# Patient Record
Sex: Female | Born: 1945 | Race: White | Hispanic: No | Marital: Married | Smoking: Never smoker
Health system: Southern US, Community
[De-identification: ages and names within clinical notes are randomized; demographics above are authoritative.]

## PROBLEM LIST (undated history)

## (undated) DIAGNOSIS — R413 Other amnesia: Secondary | ICD-10-CM

## (undated) DIAGNOSIS — M810 Age-related osteoporosis without current pathological fracture: Secondary | ICD-10-CM

## (undated) DIAGNOSIS — R609 Edema, unspecified: Secondary | ICD-10-CM

## (undated) DIAGNOSIS — K219 Gastro-esophageal reflux disease without esophagitis: Secondary | ICD-10-CM

## (undated) DIAGNOSIS — I10 Essential (primary) hypertension: Secondary | ICD-10-CM

## (undated) HISTORY — PX: CHOLECYSTECTOMY: SHX55

## (undated) HISTORY — PX: ABDOMINAL HYSTERECTOMY: SHX81

## (undated) HISTORY — DX: Essential (primary) hypertension: I10

## (undated) HISTORY — DX: Other amnesia: R41.3

## (undated) HISTORY — DX: Age-related osteoporosis without current pathological fracture: M81.0

---

## 2011-05-07 ENCOUNTER — Other Ambulatory Visit: Payer: Self-pay

## 2011-05-07 ENCOUNTER — Emergency Department (INDEPENDENT_AMBULATORY_CARE_PROVIDER_SITE_OTHER): Payer: Medicare Other

## 2011-05-07 ENCOUNTER — Emergency Department (HOSPITAL_BASED_OUTPATIENT_CLINIC_OR_DEPARTMENT_OTHER)
Admission: EM | Admit: 2011-05-07 | Discharge: 2011-05-07 | Disposition: A | Discharge status: Home / Self Care | Payer: Medicare Other | Attending: Emergency Medicine | Admitting: Emergency Medicine

## 2011-05-07 ENCOUNTER — Encounter (HOSPITAL_BASED_OUTPATIENT_CLINIC_OR_DEPARTMENT_OTHER): Payer: Self-pay | Admitting: *Deleted

## 2011-05-07 DIAGNOSIS — F29 Unspecified psychosis not due to a substance or known physiological condition: Secondary | ICD-10-CM

## 2011-05-07 DIAGNOSIS — IMO0002 Reserved for concepts with insufficient information to code with codable children: Secondary | ICD-10-CM | POA: Insufficient documentation

## 2011-05-07 DIAGNOSIS — R413 Other amnesia: Secondary | ICD-10-CM

## 2011-05-07 DIAGNOSIS — R4182 Altered mental status, unspecified: Secondary | ICD-10-CM

## 2011-05-07 DIAGNOSIS — K219 Gastro-esophageal reflux disease without esophagitis: Secondary | ICD-10-CM | POA: Insufficient documentation

## 2011-05-07 DIAGNOSIS — Z79899 Other long term (current) drug therapy: Secondary | ICD-10-CM | POA: Insufficient documentation

## 2011-05-07 HISTORY — DX: Gastro-esophageal reflux disease without esophagitis: K21.9

## 2011-05-07 HISTORY — DX: Edema, unspecified: R60.9

## 2011-05-07 LAB — CBC
HCT: 44.5 % (ref 36.0–46.0)
Hemoglobin: 15.7 g/dL — ABNORMAL HIGH (ref 12.0–15.0)
MCH: 30.6 pg (ref 26.0–34.0)
MCHC: 35.3 g/dL (ref 30.0–36.0)
MCV: 86.7 fL (ref 78.0–100.0)
RBC: 5.13 MIL/uL — ABNORMAL HIGH (ref 3.87–5.11)

## 2011-05-07 LAB — URINE MICROSCOPIC-ADD ON

## 2011-05-07 LAB — DIFFERENTIAL
Basophils Relative: 0 % (ref 0–1)
Eosinophils Absolute: 0.1 10*3/uL (ref 0.0–0.7)
Lymphs Abs: 2.6 10*3/uL (ref 0.7–4.0)
Monocytes Absolute: 0.8 10*3/uL (ref 0.1–1.0)
Monocytes Relative: 9 % (ref 3–12)

## 2011-05-07 LAB — URINALYSIS, ROUTINE W REFLEX MICROSCOPIC
Bilirubin Urine: NEGATIVE
Ketones, ur: 15 mg/dL — AB
Nitrite: NEGATIVE
Urobilinogen, UA: 0.2 mg/dL (ref 0.0–1.0)
pH: 5.5 (ref 5.0–8.0)

## 2011-05-07 LAB — COMPREHENSIVE METABOLIC PANEL
Albumin: 4.6 g/dL (ref 3.5–5.2)
Alkaline Phosphatase: 65 U/L (ref 39–117)
BUN: 28 mg/dL — ABNORMAL HIGH (ref 6–23)
Creatinine, Ser: 1.1 mg/dL (ref 0.50–1.10)
GFR calc Af Amer: 59 mL/min — ABNORMAL LOW (ref >90)
Glucose, Bld: 88 mg/dL (ref 70–99)
Total Bilirubin: 0.3 mg/dL (ref 0.3–1.2)
Total Protein: 7.6 g/dL (ref 6.0–8.3)

## 2011-05-07 LAB — TROPONIN I: Troponin I: <0.3 ng/mL (ref <0.30)

## 2011-05-07 MED ORDER — POTASSIUM CHLORIDE CRYS ER 20 MEQ PO TBCR
EXTENDED_RELEASE_TABLET | ORAL | Status: AC
  Administered 2011-05-07: 40 meq
  Filled 2011-05-07: qty 2

## 2011-05-07 MED ORDER — POTASSIUM CHLORIDE 20 MEQ PO PACK
40.0000 meq | PACK | Freq: Once | ORAL | Status: DC
  Filled 2011-05-07: qty 2

## 2011-05-07 NOTE — ED Notes (Signed)
Pt. Keeps asking about her brother dying.  Pt does not know what day it is and what is going on at times.  Pt. Is able to speak with her husband and recall some things.  Pt keeps asking about her brother dying and then ask the nurse multiple times who I am.

## 2011-05-07 NOTE — ED Notes (Signed)
Husband says they were working in the yard when she had a sudden onset of confusion. Pt is alert oriented at times. Her brother recently died and she has no recollection of event and gets emotional when event is mentioned. She is neuro intact.

## 2011-05-07 NOTE — ED Provider Notes (Signed)
History     CSN: [id number]  Arrival date & time 05/07/11  1634   First MD Initiated Contact with Patient 05/07/11 1651      Chief Complaint  Patient presents with  . Altered Mental Status    HPI Patient presents to the emergency room with complaints of amnesia. History is obtained from the patient and her husband. Husband states that they were both working out in the yard when his wife came up to him and asked what the time was. a couple minutes later she then came up to him and asked him what the date was in was she supposed to go to work.  Her husband reminded her that she had the day off from work because the office was closed. They continue their conversation the patient continued to not have any significant short-term memory. She had forgotten that her brother had recently passed away. She still could not remember the date or time. She told her husband that she couldn't remember anything. He states it might have gotten a little bit better but it has persisted. Patient has not had any trouble with speech difficulty, numbness, weakness, headache or other complaints. She's never had anything like this before. Past Medical History  Diagnosis Date  . GERD (gastroesophageal reflux disease)   . Fluid retention     Past Surgical History  Procedure Date  . Abdominal hysterectomy   . Cholecystectomy     No family history on file.  History  Substance Use Topics  . Smoking status: Never Smoker   . Smokeless tobacco: Not on file  . Alcohol Use: Yes    OB History    Grav Para Term Preterm Abortions TAB SAB Ect Mult Living                  Review of Systems  All other systems reviewed and are negative.    Allergies  Sulfa antibiotics  Home Medications   Current Outpatient Rx  Name Route Sig Dispense Refill  . HYDROCHLOROTHIAZIDE PO Oral Take by mouth.      BP 142/88  Pulse 87  Temp(Src) 97 F (36.1 C) (Oral)  Resp 16  Ht 5\' 1"  (1.549 m)  Wt 136 lb (61.689 kg)   BMI 25.70 kg/m2  SpO2 97%  Physical Exam  Nursing note and vitals reviewed. Constitutional: She appears well-developed and well-nourished. No distress.  HENT:  Head: Normocephalic and atraumatic.  Right Ear: External ear normal.  Left Ear: External ear normal.  Mouth/Throat: Oropharynx is clear and moist.  Eyes: Conjunctivae are normal. Right eye exhibits no discharge. Left eye exhibits no discharge. No scleral icterus.  Neck: Neck supple. No tracheal deviation present.  Cardiovascular: Normal rate, regular rhythm and intact distal pulses.   Pulmonary/Chest: Effort normal and breath sounds normal. No stridor. No respiratory distress. She has no wheezes. She has no rales.  Abdominal: Soft. Bowel sounds are normal. She exhibits no distension. There is no tenderness. There is no rebound and no guarding.  Musculoskeletal: She exhibits no edema and no tenderness.  Neurological: She is alert. She has normal strength. She is disoriented. No cranial nerve deficit ( ) or sensory deficit. She exhibits normal muscle tone. She displays no seizure activity. Coordination normal. GCS eye subscore is 4. GCS verbal subscore is 4. GCS motor subscore is 6.       No pronator drift bilateral upper extrem, able to hold both legs off bed for 5 seconds, sensation intact in all extremities,  no visual field cuts, no left or right sided neglect,  Pt unable to remember the date/year  Skin: Skin is warm and dry. No rash noted.  Psychiatric: She has a normal mood and affect.    ED Course  Procedures (including critical care time)  Date: 05/07/2011  Rate: 81  Rhythm: normal sinus rhythm  QRS Axis: normal  Intervals: normal  ST/T Wave abnormalities: normal  Conduction Disutrbances:none  Narrative Interpretation:   Old EKG Reviewed: none available   Labs Reviewed  CBC - Abnormal; Notable for the following:    RBC 5.13 (*)    Hemoglobin 15.7 (*)    All other components within normal limits  COMPREHENSIVE  METABOLIC PANEL - Abnormal; Notable for the following:    Potassium 3.0 (*)    BUN 28 (*)    GFR calc non Af Amer 51 (*)    GFR calc Af Amer 59 (*)    All other components within normal limits  URINALYSIS, ROUTINE W REFLEX MICROSCOPIC - Abnormal; Notable for the following:    Ketones, ur 15 (*)    Leukocytes, UA SMALL (*)    All other components within normal limits  URINE MICROSCOPIC-ADD ON - Abnormal; Notable for the following:    Bacteria, UA FEW (*)    All other components within normal limits  PROTIME-INR  APTT  DIFFERENTIAL  TROPONIN I  GLUCOSE, CAPILLARY   Ct Head Wo Contrast  05/07/2011  *RADIOLOGY REPORT*  Clinical Data: All status.  Some onset confusion.  CT HEAD WITHOUT CONTRAST  Technique:  Contiguous axial images were obtained from the base of the skull through the vertex without contrast.  Comparison: None.  Findings: No mass lesion, mass effect, midline shift, hydrocephalus, hemorrhage.  No territorial ischemia or acute infarction.  Paranasal sinuses and calvarium appear within normal limits.  Right-sided nasal septal spur.  Hyperostosis frontalis interna.  IMPRESSION: Negative CT head.  Original Report Authenticated By: Andreas Newport, M.D.    MDM  The patient is having persistent amnesia. The patient is still asking repetitive questions. Otherwise, there are no focal neurologic deficits on exam. At this time it is possible the symptoms are related to stress and anxiety associated with the death of her brother. However, it is possible that a stroke has caused the symptoms as well.  I do feel it is reasonable to have further evaluation including MRI scanning. I discussed these findings with the patient and her husband and they agree about additional testing. Her primary care doctor is Dr. Alben Spittle in [facility].  The patient requests transfer to [facility].        Celene Kras, MD 05/07/11 [phone]

## 2017-01-26 ENCOUNTER — Encounter: Payer: Self-pay | Admitting: Neurology

## 2017-01-27 ENCOUNTER — Encounter: Payer: Self-pay | Admitting: Neurology

## 2017-01-27 ENCOUNTER — Encounter (INDEPENDENT_AMBULATORY_CARE_PROVIDER_SITE_OTHER): Payer: Self-pay

## 2017-01-27 ENCOUNTER — Ambulatory Visit (INDEPENDENT_AMBULATORY_CARE_PROVIDER_SITE_OTHER): Payer: Medicare Other | Admitting: Neurology

## 2017-01-27 ENCOUNTER — Other Ambulatory Visit: Payer: Self-pay | Admitting: Neurology

## 2017-01-27 VITALS — BP 157/92 | HR 74 | Ht 62.0 in | Wt 130.0 lb

## 2017-01-27 DIAGNOSIS — R292 Abnormal reflex: Secondary | ICD-10-CM | POA: Diagnosis not present

## 2017-01-27 DIAGNOSIS — G3109 Other frontotemporal dementia: Secondary | ICD-10-CM

## 2017-01-27 DIAGNOSIS — R251 Tremor, unspecified: Secondary | ICD-10-CM | POA: Insufficient documentation

## 2017-01-27 DIAGNOSIS — R488 Other symbolic dysfunctions: Secondary | ICD-10-CM

## 2017-01-27 DIAGNOSIS — R413 Other amnesia: Secondary | ICD-10-CM

## 2017-01-27 DIAGNOSIS — G319 Degenerative disease of nervous system, unspecified: Secondary | ICD-10-CM

## 2017-01-27 NOTE — Patient Instructions (Signed)
Dementia Dementia means losing some of your brain ability. People with dementia may have problems with:  Memory.  Making decisions.  Behavior.  Speaking.  Thinking.  Solving problems.  Follow these instructions at home: Medicine  Take over-the-counter and prescription medicines only as told by your doctor.  Avoid taking medicines that can change how you think. These include pain or sleeping medicines. Lifestyle   Make healthy choices: ? Be active as told by your doctor. ? Do not use any tobacco products, such as cigarettes, chewing tobacco, and e-cigarettes. If you need help quitting, ask your doctor. ? Eat a healthy diet. ? When you get stressed, do something to help yourself relax. Your doctor can give you tips. ? Spend time with other people.  Drink enough fluid to keep your pee (urine) clear or pale yellow.  Make sure you get good sleep. Use these tips to help you get a good night's rest: ? Try not to take naps during the day. ? Keep your sleeping area dark and cool. ? In the few hours before you go to bed, try not to do any exercise. ? Try not to have foods and drinks with caffeine in the evening. General instructions  Talk with your doctor to figure out: ? What you need help with. ? What your safety needs are.  If you were given a bracelet that tracks your location, make sure to wear it.  Keep all follow-up visits as told by your doctor. This is important. Contact a doctor if:  You have any new problems.  You have problems with choking or swallowing.  You have any symptoms of a different sickness. Get help right away if:  You have a fever.  You feel mixed up (confused) or more mixed up than before.  You have new sleepiness.  You have sleepiness that gets worse.  You have a hard time staying awake.  You or your family members are worried for your safety. This information is not intended to replace advice given to you by your health care  provider. Make sure you discuss any questions you have with your health care provider. Document Released: 01/08/2008 Document Revised: 07/03/2015 Document Reviewed: 10/23/2014 Elsevier Interactive Patient Education  2018 Elsevier Inc.  

## 2017-01-27 NOTE — Progress Notes (Signed)
SLEEP MEDICINE CLINIC   Provider:  Melvyn Novasarmen  Dariush Mcnellis, Jade Bell  Primary Care Physician:  Jade Bell, Jade M, MD   Referring Provider: Jolene Bell, Jade M, MD    Chief Complaint  Patient presents with  . New Patient (Initial Visit)    pt with husband, rm [id number]. pt's husband states this happened year and half ago. stress induced.     HPI:  Jade AdamSandra Bell is a 71 y.o. female , seen here  in a referral from Dr. William Bell for Memory loss.   His Jade Bell is very concerned about her perceived cognitive decline.  5 years ago in the year 2013 she had suffered a transient global amnesia spell related to the death of her brother.  She had no other neuropsychological events, and there is no history of brain trauma. She is awaiting a neuropsychological detailed testing in March of this year at [facility]. Jade Bell had great difficulties with a Montreal cognitive assessment but was able to work on the Mini-Mental Status Examination by [employer].  She scored 21 out of 30 points.  She had difficulties with calculation but she was able remarkably to remember 2 out of 5 words which is often the most difficult part of the test.  I wonder if her anxiety is partially aggravating the test results. She has good days and good hours, is not all the time the same.  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   The last 18 months have certainly been difficult for the patient.  Jade Bell is concerned that she may enter a future like stage while driving or being away from home and therefore the couple decided that she should not drive at this time.  He balances the checkbook and keeps the family finances, does most of the shopping and cooking.  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  He noticed repetitive questions and perseveration. Whats  the date, what is the day ? She is unable to recall appointments.  Chief complaint according to patient : " I am so anxious, I loose my mind"    Family history- no dementia. Mother passed at 5165- lung cancer , father was 2892.   Social history: non smoker, 1 glass of wine once a week or once a month. Caffeine : coffee - 2-3 cups a day, no sodas. Married, adult children. 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  husband is a Runner, broadcasting/film/videoteacher. [employer] classes after HS>   Review of Systems: Out of a complete 14 system review, the patient complains of only the following symptoms, and all other reviewed systems are negative.    Social History   Socioeconomic History  . Marital status: Married    Spouse name: Not on file  . Number of children: Not on file  . Years of education: Not on file  . Highest education level: Not on file  Social Needs  . Financial resource strain: Not on file  . Food insecurity - worry: Not on file  . Food insecurity - inability: Not on file  . Transportation needs - medical: Not on file  . Transportation needs - non-medical: Not on file  Occupational History  . Not on file  Tobacco Use  . Smoking status: Never Smoker  . Smokeless  tobacco: Never Used  Substance and Sexual Activity  . Alcohol use: Yes  . Drug use: No  . Sexual activity: Not on file  Other Topics Concern  . Not on file  Social History Narrative  . Not on file    No family history on file.  Past Medical History:  Diagnosis Date  . Fluid retention   . GERD (gastroesophageal reflux disease)   . Hypertension   . Memory changes   . Osteoporosis     Past Surgical History:  Procedure Laterality Date  . ABDOMINAL HYSTERECTOMY    . CHOLECYSTECTOMY      Current Outpatient Medications  Medication Sig Dispense Refill  . alendronate (FOSAMAX) 70 MG tablet Take 70 mg by mouth once a week. Take with a full glass of water on an empty stomach.    [name]  aspirin 81 MG tablet Take 81 mg by mouth daily.    . cholecalciferol (VITAMIN Bell) 1000 units tablet Take by mouth.    . hydrochlorothiazide (MICROZIDE) 12.5 MG capsule Take 1 capsule by mouth daily.     . Multiple Vitamin (MULTIVITAMIN) tablet Take 1 tablet by mouth daily.    [name] omeprazole (PRILOSEC) 40 MG capsule Take 40 mg by mouth daily.     No current facility-administered medications for this visit.     Allergies as of 01/27/2017 - Review Complete 01/27/2017  Allergen Reaction Noted  . Sulfa antibiotics  05/07/2011    Vitals: BP (!) 157/92   Pulse 74   Ht 5\' 2"  (1.575 m)   Wt 130 lb (59 kg)   BMI 23.78 kg/m  Last Weight:  Wt Readings from Last 1 Encounters:  01/27/17 130 lb (59 kg)   ZOX:WRUE mass index is 23.78 kg/m.     Last Height:   Ht Readings from Last 1 Encounters:  01/27/17 5\' 2"  (1.575 m)    MMSE - Mini Mental State Exam 01/27/2017  Orientation to time 3  Orientation to Place 4  Registration 3  Attention/ Calculation 2  Recall 2  Language- name 2 objects 1  Language- repeat 1  Language- follow 3 step command 3  Language- read & follow direction 1  Write a sentence 1  Copy design 0  Total score 21     Physical exam:  General: The patient is awake, alert and appears not in acute distress. The patient is well groomed. Head: Normocephalic, atraumatic. Neck is supple.  Cardiovascular:  Regular rate and rhythm , without  murmurs or carotid bruit, and without distended neck veins. Respiratory: Lungs are clear to auscultation. Skin:  Without evidence of edema, or rash Trunk: BMI is nl. The patient's posture is erect, not stooped.    Neurologic exam : The patient is awake and alert, oriented to place and time.   Memory subjective  described as impaired.  MOCA: Montreal Cognitive Assessment  01/27/2017  Naming (0/3) 2  Attention span & concentration ability appears impaired.  Speech is fluent, without dysarthria, dysphonia but she looks for words.    Mood and affect are anxious..   Cranial nerves: Pupils are equal and briskly reactive to light. Funduscopic exam without evidence of pallor or edema. Extraocular movements  in vertical and horizontal planes intact and without nystagmus. Visual fields by finger perimetry are intact. Hearing to finger rub intact.   Facial sensation intact to fine touch.  Facial motor strength is symmetric and tongue and uvula move midline. Shoulder shrug was symmetrical.   Motor exam: This is  Jade Bell has bilaterally equal muscle bulk, and elevated muscle tone, there is some cogwheeling noted over biceps triceps at the shoulder musculature as well as at the wrist.  There are some arthritic related muscle atrophy's and joint swelling seen in both hands.  Grip strength was surprisingly strong,  Sensory:  Fine touch, pinprick and vibration were tested in all extremities. Proprioception tested in the upper extremities was normal.  Coordination: There is bilateral mild action tremor noted with finger to nose maneuver bilateral some pronation, indicating weakness of the upper extremity musculature.  There was dysmetria. Gait and station: Patient walks without assistive device with normal arm swing. Turns with very small steps, took about 6 steps to turn 180 degrees . Romberg testing is  negative.  Deep tendon reflexes: in the  upper and lower extremities were very brisk there is actually a cross reaction to the patellar reflex, and I would rate this reflex level at 4+. There are up-going babinski responses.   Assessment:  After physical and neurologic examination, review of laboratory studies,  Personal review of imaging studies, reports of other /same  Imaging studies, results of polysomnography and / or neurophysiology testing and pre-existing records as far as provided in visit., my assessment is   1) Dementia- Todays Mini-Mental Status Examination may have been influenced by the patient's level of anxiety and worry,  she certainly felt under stress and a testing situation.   However a 21 out of 30 Mini-Mental Status Examination would indicate a cognitive impairment.  The distribution of cognitive weakness is atypical as she could recall 2 out of 3 delayed recall words usually the first deficits seen in Alzheimer patient would be the loss of recall words.  2) the patient is slightly jittery does have a tremor with finger to nose test, does have dysmetria.  Muscle tone is elevated  3) Mrs. Jade Bell is hyperreflexic.  This affects the upper and lower extremities and I will need to obtain an MRI of the brain and MRA I would like the studies with contrast.  She does not not have a history of traumatic brain injury, she does not have a history of malignancy, she has not been on heavy duty psychotropic medications.   The patient was advised of the nature of the diagnosed disorder , the treatment options and the  risks for general health and wellness arising from not treating the condition.   I spent more than 60 minutes of face to face time with the patient.  Greater than 50% of time was spent in counseling and coordination of care. We have discussed the diagnosis and differential and I answered the patient's questions.    Plan:  Treatment plan and additional workup : Dementia work up- MRI brain images, not MRA.  Labs- EEG    Melvyn NovasARMEN Klea Nall, MD 01/27/2017, 2:12 PM  Certified in Neurology by ABPN Certified in Sleep Medicine by [facility]  [facility] [address], [address] [place], [place] [address]

## 2017-01-27 NOTE — Addendum Note (Signed)
Addended by: Tamera StandsHINNANT, Vadhir Mcnay D on: 01/27/2017 03:34 PM   Modules accepted: Orders

## 2017-01-28 LAB — COMPREHENSIVE METABOLIC PANEL
ALT: 19 IU/L (ref 0–32)
AST: 25 IU/L (ref 0–40)
Albumin/Globulin Ratio: 2 (ref 1.2–2.2)
Albumin: 4.7 g/dL (ref 3.5–4.8)
Alkaline Phosphatase: 54 IU/L (ref 39–117)
BUN/Creatinine Ratio: 17 (ref 12–28)
BUN: 17 mg/dL (ref 8–27)
Bilirubin Total: 0.4 mg/dL (ref 0.0–1.2)
CALCIUM: 10.7 mg/dL — AB (ref 8.7–10.3)
CO2: 25 mmol/L (ref 20–29)
CREATININE: 1.03 mg/dL — AB (ref 0.57–1.00)
Chloride: 102 mmol/L (ref 96–106)
GFR, EST AFRICAN AMERICAN: 62 mL/min/{1.73_m2} (ref >59)
GFR, EST NON AFRICAN AMERICAN: 54 mL/min/{1.73_m2} — AB (ref >59)
GLUCOSE: 92 mg/dL (ref 65–99)
Globulin, Total: 2.3 g/dL (ref 1.5–4.5)
POTASSIUM: 4.1 mmol/L (ref 3.5–5.2)
Sodium: 143 mmol/L (ref 134–144)
TOTAL PROTEIN: 7 g/dL (ref 6.0–8.5)

## 2017-01-29 ENCOUNTER — Ambulatory Visit (HOSPITAL_BASED_OUTPATIENT_CLINIC_OR_DEPARTMENT_OTHER)
Admission: RE | Admit: 2017-01-29 | Discharge: 2017-01-29 | Disposition: A | Discharge status: Home / Self Care | Payer: Medicare Other | Source: Ambulatory Visit | Attending: Neurology | Admitting: Neurology

## 2017-01-29 DIAGNOSIS — G3109 Other frontotemporal dementia: Secondary | ICD-10-CM | POA: Insufficient documentation

## 2017-01-29 DIAGNOSIS — G319 Degenerative disease of nervous system, unspecified: Secondary | ICD-10-CM | POA: Diagnosis not present

## 2017-01-29 MED ORDER — GADOBENATE DIMEGLUMINE 529 MG/ML IV SOLN
10.0000 mL | Freq: Once | INTRAVENOUS | Status: AC | PRN
  Administered 2017-01-29: 10 mL via INTRAVENOUS

## 2017-02-02 LAB — CBC WITH DIFFERENTIAL/PLATELET
BASOS ABS: 0 10*3/uL (ref 0.0–0.2)
BASOS: 1 %
EOS (ABSOLUTE): 0.1 10*3/uL (ref 0.0–0.4)
Eos: 1 %
Hematocrit: 47.4 % — ABNORMAL HIGH (ref 34.0–46.6)
Hemoglobin: 15.6 g/dL (ref 11.1–15.9)
IMMATURE GRANS (ABS): 0 10*3/uL (ref 0.0–0.1)
IMMATURE GRANULOCYTES: 0 %
LYMPHS: 29 %
Lymphocytes Absolute: 2.5 10*3/uL (ref 0.7–3.1)
MCH: 30.3 pg (ref 26.6–33.0)
MCHC: 32.9 g/dL (ref 31.5–35.7)
MCV: 92 fL (ref 79–97)
MONOS ABS: 0.8 10*3/uL (ref 0.1–0.9)
Monocytes: 9 %
NEUTROS PCT: 60 %
Neutrophils Absolute: 5.2 10*3/uL (ref 1.4–7.0)
PLATELETS: 318 10*3/uL (ref 150–379)
RBC: 5.15 x10E6/uL (ref 3.77–5.28)
RDW: 14 % (ref 12.3–15.4)
WBC: 8.6 10*3/uL (ref 3.4–10.8)

## 2017-02-02 LAB — ANA W/REFLEX

## 2017-02-02 LAB — TSH+FREE T4
Free T4: 1.41 ng/dL (ref 0.82–1.77)
TSH: 1.93 u[IU]/mL (ref 0.450–4.500)

## 2017-02-02 LAB — METHYLMALONIC ACID, SERUM

## 2017-02-03 ENCOUNTER — Telehealth: Payer: Self-pay | Admitting: *Deleted

## 2017-02-03 NOTE — Telephone Encounter (Signed)
Spoke with patient's husband, [place] on [place] and informed him that overall her MRI brain showed some mild brain atrophy, but not in one area predominantly. Advised there is no tumor, demyelination, stroke or scar tissue formation. Advised him Dr Vickey Hugerohmeier considers this brain unremarkable for her age. Informed him of her lab results: Elevated calcium- perhaps due to supplements.  Kidney function is slightly reduced, but improved over the lab 5 years ago. No anemia is noted. The Thyroid hormone is normal, and she is negative for b 12 deficiency. Other tests are still pending. Husband verbalized understanding, appreciation for call.

## 2017-02-05 LAB — METHYLMALONIC ACID, SERUM: Methylmalonic Acid: 162 nmol/L (ref 0–378)

## 2017-02-05 LAB — ANA W/REFLEX: ANA: NEGATIVE

## 2017-02-05 LAB — SPECIMEN STATUS REPORT

## 2017-02-10 ENCOUNTER — Ambulatory Visit (INDEPENDENT_AMBULATORY_CARE_PROVIDER_SITE_OTHER): Payer: Medicare Other | Admitting: Neurology

## 2017-02-10 DIAGNOSIS — G319 Degenerative disease of nervous system, unspecified: Secondary | ICD-10-CM

## 2017-02-10 DIAGNOSIS — R410 Disorientation, unspecified: Secondary | ICD-10-CM

## 2017-02-10 DIAGNOSIS — R488 Other symbolic dysfunctions: Secondary | ICD-10-CM

## 2017-02-10 DIAGNOSIS — G3109 Other frontotemporal dementia: Secondary | ICD-10-CM

## 2017-02-10 DIAGNOSIS — R292 Abnormal reflex: Secondary | ICD-10-CM

## 2017-02-10 DIAGNOSIS — R251 Tremor, unspecified: Secondary | ICD-10-CM

## 2017-02-11 NOTE — Procedures (Signed)
   HISTORY: 72 years old with memory loss,   TECHNIQUE:  16 channel EEG was performed based on standard 10-16 international system. One channel was dedicated to EKG, which has demonstrates normal sinus rhythm of 72 beats per minutes.  Upon awakening, the posterior background activity was well-developed, with mixed alpha and theta range activities, reactive to eye opening and closure.  There was no evidence of epileptiform discharge.  There are frequent muscle artifacts.  Photic stimulation was performed, which induced a symmetric photic driving.  Hyperventilation was performed, there was no abnormality elicit.  No sleep was achieved.  CONCLUSION: This is a mild abnormal EEG.  There is electrodiagnostic evidence of mild background slowing, indicating mild bi-hemisphere malfunction.  Common etiology are metabolic toxic, central nervous system degenerative disorders.  Levert FeinsteinYijun Cayman Brogden, M.D. Ph.D.  [facility] [address] [place], [place] [address] Phone: [phone] Fax:      [phone]

## 2017-02-15 ENCOUNTER — Telehealth: Payer: Self-pay | Admitting: Neurology

## 2017-02-15 NOTE — Telephone Encounter (Signed)
Called the patient and made her aware of the EEG findings. The patient has a follow up apt for 1/24. We will discuss this in more detail at her follow up apt. Pt verbalized understanding.

## 2017-02-15 NOTE — Telephone Encounter (Signed)
-----   Message from Melvyn Novasarmen Dohmeier, MD sent at 02/14/2017  8:22 AM EST ----- mildy slowed EEG- non specific changes. No sleep was recorded.

## 2017-03-03 ENCOUNTER — Encounter: Payer: Self-pay | Admitting: Neurology

## 2017-03-03 ENCOUNTER — Ambulatory Visit (INDEPENDENT_AMBULATORY_CARE_PROVIDER_SITE_OTHER): Payer: Medicare Other | Admitting: Neurology

## 2017-03-03 DIAGNOSIS — F0390 Unspecified dementia without behavioral disturbance: Secondary | ICD-10-CM | POA: Diagnosis not present

## 2017-03-03 MED ORDER — DONEPEZIL HCL 5 MG PO TABS: ORAL_TABLET | ORAL | 0 refills | Status: DC

## 2017-03-03 NOTE — Progress Notes (Signed)
[facility]   Provider:  Melvyn Novas, [place]  Primary Care Physician:  Jolene Provost, MD   Referring Provider: Jolene Provost, MD    Chief Complaint  Patient presents with  . Follow-up    pt is with husband, rm 10.     HPI:  Jade Bell is a 72 y.o. female , seen here for Memory loss.   CONSULT: His Zuluaga is very concerned about her perceived cognitive decline.  5 years ago in the year 2013 she had suffered a transient global amnesia spell related to the death of her brother. She had no other neuropsychological events, and there is no history of brain trauma. She is awaiting a neuropsychological detailed testing in March of this year at [facility]. Jade Bell had great difficulties with a Montreal cognitive assessment but was able to work on the Mini-Mental Status Examination by [employer].  She scored 21 out of 30 points.  She had difficulties with calculation but she was able remarkably to remember 2 out of 5 words which is often the most difficult part of the test.  I wonder if her anxiety is partially aggravating the test results. She has good days and good hours, is not all the time the same.  She is 72 years old, and states that she has PTSD- her daughter coded three times in 09-18-2015, coded in an in office procedure, twice more in ICU while hospitalized- and the Crouches became a caretaker of her 20 year old daughter.  The last 18 months have certainly been difficult for the patient.  Mr. Chestine Spore is concerned that she may enter a future like stage while driving or being away from home and therefore the couple decided that she should not drive at this time.  He balances the checkbook and keeps the family finances, does most of the shopping and cooking.  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  He noticed repetitive questions and perseveration. Whats the date, what is the day ? She is unable to recall appointments.  Chief complaint according to patient : " I  am so anxious, I loose my mind"  Family history- no dementia. Mother passed at 69- lung cancer , father was 62.   Social history: non smoker, 1 glass of wine once a week or once a month. Caffeine : coffee - 2-3 cups a day, no sodas. Married, adult children. 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  husband is a Runner, broadcasting/film/video. [employer] classes after HS.   03-03-2017, I have the pleasure of meeting with Mrs. [name] today, who has undergone an EEG that showed mild background slowing.  The EEG was interpreted by my colleague Dr. Terrace Arabia.  Her MRI of the brain with and without contrast showed no abnormalities there was a mild generalized atrophy can be seen related no acute abnormality, no tumor or stroke or bleed. My goal is to enroll into research - if not possible start aricept and   Review of Systems: Out of a complete 14 system review, the patient complains of only the following symptoms, and all other reviewed systems are negative.  Memory loss, high anxiety level,     Social History   Socioeconomic History  . Marital status: Married    Spouse name: Not on file  . Number of children: Not on file  . Years of education: Not on file  . Highest education level: Not on file  Social Needs  .  Financial resource strain: Not on file  . Food insecurity - worry: Not on file  . Food insecurity - inability: Not on file  . Transportation needs - medical: Not on file  . Transportation needs - non-medical: Not on file  Occupational History  . Not on file  Tobacco Use  . Smoking status: Never Smoker  . Smokeless tobacco: Never Used  Substance and Sexual Activity  . Alcohol use: Yes  . Drug use: No  . Sexual activity: Not on file  Other Topics Concern  . Not on file  Social History Narrative  . Not on file    No family history on file.  Past Medical History:  Diagnosis Date  . Fluid retention   . GERD (gastroesophageal reflux disease)   .  Hypertension   . Memory changes   . Osteoporosis     Past Surgical History:  Procedure Laterality Date  . ABDOMINAL HYSTERECTOMY    . CHOLECYSTECTOMY      Current Outpatient Medications  Medication Sig Dispense Refill  . alendronate (FOSAMAX) 70 MG tablet Take 70 mg by mouth once a week. Take with a full glass of water on an empty stomach.    [name] aspirin 81 MG tablet Take 81 mg by mouth daily.    [name] CALCIUM-VITAMIN D PO Take 1,000 mg by mouth 2 (two) times daily.    . Cholecalciferol (VITAMIN D3) 400 units tablet Take by mouth.    . hydrochlorothiazide (MICROZIDE) 12.5 MG capsule Take 1 capsule by mouth daily.     . Omega-3 Fatty Acids (OMEGA-3 FISH OIL PO) Take 1,000 mg by mouth daily.     No current facility-administered medications for this visit.     Allergies as of 03/03/2017 - Review Complete 03/03/2017  Allergen Reaction Noted  . Sulfa antibiotics  05/07/2011    Vitals: BP 121/77   Pulse 63   Ht 5\' 2"  (1.575 m)   Wt 131 lb (59.4 kg)   BMI 23.96 kg/m  Last Weight:  Wt Readings from Last 1 Encounters:  03/03/17 131 lb (59.4 kg)   ONG:EXBM mass index is 23.96 kg/m.     Last Height:   Ht Readings from Last 1 Encounters:  03/03/17 5\' 2"  (1.575 m)    MMSE - Mini Mental State Exam 01/27/2017  Orientation to time 3  Orientation to Place 4  Registration 3  Attention/ Calculation 2  Recall 2  Language- name 2 objects 1  Language- repeat 1  Language- follow 3 step command 3  Language- read & follow direction 1  Write a sentence 1  Copy design 0  Total score 21     Physical exam:  General: The patient is awake, alert and appears not in acute distress. The patient is well groomed. Head: Normocephalic, atraumatic. Neck is supple.  Cardiovascular:  Regular rate and rhythm , without  murmurs or carotid bruit, and without distended neck veins. Respiratory: Lungs are clear to auscultation. Skin:  Without evidence of edema, or rash Trunk: BMI is nl. The patient's  posture is erect, not stooped.    Neurologic exam : The patient is awake and alert, oriented to place and time.   Memory subjective  described as impaired.  MOCA: Montreal Cognitive Assessment  01/27/2017  Naming (0/3) 2  Attention span & concentration ability appears impaired.  Speech is fluent, without dysarthria, dysphonia but she looks for words- is demure.  Mood and affect are anxious.   Cranial nerves: Pupils are equal and  briskly reactive to light.Extraocular movements  in vertical and horizontal planes intact and without nystagmus. Visual fields by finger perimetry are intact.Hearing to finger rub intact.  Facial sensation intact to fine touch. Facial motor strength is symmetric and tongue and uvula move midline. Shoulder shrug was symmetrical.   Motor exam: This is Juanetta SnowCrouch has bilaterally equal muscle bulk, and elevated muscle tone, there is some cogwheeling noted over biceps triceps at the shoulder musculature as well as at the wrist.  There are some arthritic related muscle atrophy's and joint swelling seen in both hands.  Grip strength was surprisingly strong, Sensory:  Fine touch, pinprick and vibration were tested in all extremities. Proprioception tested in the upper extremities was normal. Coordination: There is bilateral mild action tremor noted with finger to nose maneuver bilateral some pronation, indicating weakness of the upper extremity musculature.  There was dysmetria. Gait and station: Patient walks without assistive device with normal arm swing. Turns with very small steps, took about 6 steps to turn 180 degrees . Romberg testing is negative. Deep tendon reflexes: in the  upper and lower extremities were very brisk there is actually a cross reaction to the patellar reflex, and I would rate this reflex level at 4+. There are up-going babinski responses.   Assessment:  After physical and neurologic examination, review of laboratory studies,  Personal review of imaging  studies, reports of other /same  Imaging studies, results of polysomnography and / or neurophysiology testing and pre-existing records as far as provided in visit., my assessment is   1) Dementia- last December's  Mini-Mental Status Examination may have been influenced by the patient's level of anxiety and worry, she certainly felt under stress and a testing situation.   However a 21 out of 30 Mini-Mental Status Examination does  indicate a cognitive impairment.  The distribution of cognitive weakness is atypical as she could recall 2 out of 3 delayed recall words usually the first deficits seen in Alzheimer patient would be the loss of recall words.  2) the patient is slightly jittery does have a tremor with finger to nose test, does have dysmetria.  Muscle tone is elevated. Mrs. Juanetta SnowCrouch is hyperreflexic.  This affects the upper and lower extremities and I will need to obtain an MRI of the brain and MRA I would like the studies with contrast.  She does not not have a history of traumatic brain injury, she does not have a history of malignancy, she has not been on psychotropic medications.   The patient was advised of the nature of the diagnosed disorder , the treatment options and the  risks for general health and wellness arising from not treating the condition. The patient's MRI was largely unremarkable her EEG was a little slower but this is also nonspecific overall slower and did not have a focal abnormality or epileptiform discharge. I spent more than 25 minutes of face to face time with the patient.  Greater than 50% of time was spent in counseling and coordination of care. We have discussed the diagnosis and differential and I answered the patient's questions.    Plan:  Treatment plan and additional workup :  PET scan brain- functional memory MRI- evaluate for dementia. Referral to Research, scan may be paid for.   Aricept:  To start next week if patient is not a research candidate .  5 mg at  night time , starting next  Thursday .      Melvyn NovasARMEN Rhianne Soman, MD 03/03/2017, 8:32 AM  Certified in Neurology by ABPN Certified in [place] by [facility] [address] [place], [place] [address]

## 2017-03-07 ENCOUNTER — Other Ambulatory Visit: Payer: Self-pay | Admitting: Neurology

## 2017-03-07 ENCOUNTER — Telehealth: Payer: Self-pay | Admitting: Neurology

## 2017-03-07 DIAGNOSIS — G3109 Other frontotemporal dementia: Secondary | ICD-10-CM

## 2017-03-07 DIAGNOSIS — G309 Alzheimer's disease, unspecified: Secondary | ICD-10-CM

## 2017-03-07 MED ORDER — DONEPEZIL HCL 5 MG PO TABS: 10.0000 mg | ORAL_TABLET | Freq: Every day | ORAL | 3 refills | Status: AC

## 2017-03-07 NOTE — Telephone Encounter (Signed)
Pt husband(on DPR) has called to inform pt has decided not to participate in clinical trial.  Husband would like a call back to know if they can move forward in pt taking the Aricept.  Please call

## 2017-03-07 NOTE — Telephone Encounter (Signed)
Called the patient and talked with her and the husband and informed them I will send the script of the aricept to [facility] on file. I also told them Dr Vickey Hugerohmeier would like them to have a PET scan. I scheduled a f/u apt in July. Jade Bell verbalized understanding. Jade Bell had no questions at this time but was encouraged to call back if questions arise.

## 2017-03-07 NOTE — Addendum Note (Signed)
Addended by: Melvyn NovasHMEIER, Leslea Vowles on: 03/07/2017 04:50 PM   Modules accepted: Orders

## 2017-03-08 ENCOUNTER — Telehealth: Payer: Self-pay | Admitting: Neurology

## 2017-03-08 NOTE — Telephone Encounter (Signed)
Patient decided against trial participation ( not sure why). CD

## 2017-03-08 NOTE — Telephone Encounter (Signed)
-----   Message from Azucena CecilMargie M Bell sent at 03/08/2017 10:13 AM EST ----- Hi All,  I spoke with patient and CG (husband, Jade StallionGeorge) during her 1/24 visit and supplied both ICF's for [employer] as well as [employer]. Jade Bell phoned yesterday 1/28 and explained he nor Jade Bell are interested in participating in either study at this time d/t possibility of placebo. They desire to begin regime of Aricept/Namenda.  Thanks, Jade  ----- Message ----- From: Andi HenceSabir, Rizwan Sent: 03/08/2017   9:31 AM To: Melvyn Novasarmen Monya Kozakiewicz, MD, Suanne MarkerVikram R Penumalli, MD, #  Yes, PET is part of the trial if she qualified.  Must be on stable dose of Aricept for 3 months before participation. We can pre screen.  ----- Message ----- From: Melvyn Novasohmeier, Arlin Savona, MD Sent: 03/03/2017   8:49 AM To: Suanne MarkerVikram R Penumalli, MD, Judi Congasey C Bruno, RN, #  RESEARCH :  MMSE 22 out of 30, otherwise healthy- no family history of dementia not yet on medication. Can she participate in research trial for early alzheimer's? Needs a PET scan - is that part of the trial ?   If not, need to order these tests now and will start aricept.

## 2017-03-08 NOTE — Telephone Encounter (Signed)
Jade Bell, Jade M  Bell, Jade Bell; Kelvin Sennett, Hamilton Collegearmen, MD  Cc: Jade MarkerPenumalli, Vikram R, MD; Jade CongBruno, Jade Bell, Jade Bell        Hi All,   I spoke with patient and CG (husband, Jade Bell) during her 1/24 visit and supplied both ICF's for [employer] as well as [employer], [employer]. Jade Bell phoned yesterday 1/28 and explained he nor [name] are interested in participating in either study at this time d/t possibility of placebo. They desire to begin regime of Aricept/Namenda.   Thanks,  Jade   Previous Messages    ----- Message -----  From: Andi HenceSabir, Jade Bell  Sent: 03/08/2017  9:31 AM  To: Melvyn Novasarmen Joliene Salvador, MD, Jade MarkerVikram R Penumalli, MD, *   Yes, PET is part of the trial if she qualified. Must be on stable dose of Aricept for 3 months before participation.  We can pre screen.   ----- Message -----  From: Melvyn Novasohmeier, Rilya Longo, MD  Sent: 03/03/2017  8:49 AM  To: Jade MarkerVikram R Penumalli, MD, Jade Congasey Bell Bruno, Jade Bell, *   RESEARCH : MMSE 22 out of 30, otherwise healthy- no family history of dementia not yet on medication. Can she participate in research trial for early alzheimer's? Needs a PET scan - is that part of the trial ?   If not, need to order these tests now and will start aricept.

## 2017-03-08 NOTE — Telephone Encounter (Signed)
-----   Message from Suanne MarkerVikram R Penumalli, MD sent at 03/03/2017 11:03 AM EST ----- Jade Bell is out of office this week. I will ask margie to take a look and see if patient qualifies.   -VRP  ----- Message ----- From: Melvyn Novasohmeier, Aanyah Loa, MD Sent: 03/03/2017   8:49 AM To: Suanne MarkerVikram R Penumalli, MD, Judi Congasey C Bruno, RN, #  RESEARCH :  MMSE 22 out of 30, otherwise healthy- no family history of dementia not yet on medication. Can she participate in research trial for early alzheimer's? Needs a PET scan - is that part of the trial ?   If not, need to order these tests now and will start aricept.

## 2017-03-11 ENCOUNTER — Telehealth: Payer: Self-pay | Admitting: Neurology

## 2017-03-11 NOTE — Telephone Encounter (Signed)
I [facility] about this patients NM PET to see if she is having it through research. [facility] she stated she is not having it through research and that she would have to have it through the clinical side. I informed Amber of this and Amber stated she is not sure if insurance will cover this. Amber would like a call back to see exactly what is going on with this patient and this scan. The best number to contact her is [phone].

## 2017-03-14 NOTE — Telephone Encounter (Signed)
Pt husband stated that they would not be participating in the trial at this time. The husband is aware that Dr Vickey Hugerohmeier was ordering this scan and they are willing to see if insurance will cover it. They are aware that insurance may not cover it. If they can run the test and see if they can get approval and just update the patient and her husband then they can make the decision at that time on whether or not to proceed further.

## 2017-03-14 NOTE — Telephone Encounter (Signed)
This type pf PET Scan is not covered [employer] insurance.

## 2017-03-14 NOTE — Telephone Encounter (Signed)
I have spoke to patient's husband and and they are not doing PET Scan or clinical trial.

## 2017-04-28 ENCOUNTER — Telehealth: Payer: Self-pay | Admitting: Neurology

## 2017-04-28 MED ORDER — MEMANTINE HCL 28 X 5 MG & 21 X 10 MG PO TABS: ORAL_TABLET | ORAL | 12 refills | Status: AC

## 2017-04-28 NOTE — Addendum Note (Signed)
Addended by: Melvyn NovasHMEIER, Justin Meisenheimer on: 04/28/2017 04:53 PM   Modules accepted: Orders

## 2017-04-28 NOTE — Telephone Encounter (Signed)
Pt husband(on DPR) is calling ZO:[id number]:donepezil (ARICEPT) 5 MG tablet, husband states since the 1st of Feb pt has had diarrhea.  He states he has even cut tablet in half and it is still too strong for pt.  Husband is asking for a call, [phone] is his mobile if not available on home#

## 2017-04-28 NOTE — Telephone Encounter (Signed)
Lets titrate to Namenda and after 30 days see if  we can take Aricept off. Most patients use both.

## 2017-04-29 NOTE — Telephone Encounter (Signed)
Called the patients husband. I made him aware that Dr Vickey Hugerohmeier would like to attempt to use Namenda for her. She wants her to titrate her up to the dosage. I informed him that the side effects can sometimes be the same as the aricept but that it worth trying to see if she can tolerate. He agreed with this plan. I instructed him to call if he had any questions. Pt's husband verbalized understanding and was appreciative for the call back.

## 2017-08-15 ENCOUNTER — Ambulatory Visit: Payer: Self-pay | Admitting: Neurology

## 2017-08-22 ENCOUNTER — Ambulatory Visit: Payer: Medicare Other | Admitting: Neurology

## 2018-07-16 IMAGING — MR MR HEAD WO/W CM
10 of 12 series · 38 of 48 positions shown · IV contrast (multihance)
Comparison: CT 05/07/2011

CLINICAL DATA: Episodic memory loss and confusion. Possible
frontotemporal dementia.

EXAM:
MRI HEAD WITHOUT AND WITH CONTRAST
TECHNIQUE: Multiplanar, multiecho pulse sequences of the brain and surrounding
structures were obtained without and with intravenous contrast.
CONTRAST:  10mL MULTIHANCE GADOBENATE DIMEGLUMINE 529 MG/ML IV SOLN

[Series 2: T1 · sagittal · 5.0mm · 0.45mm/px · 3 of 23 slices shown]
[im 1/23]
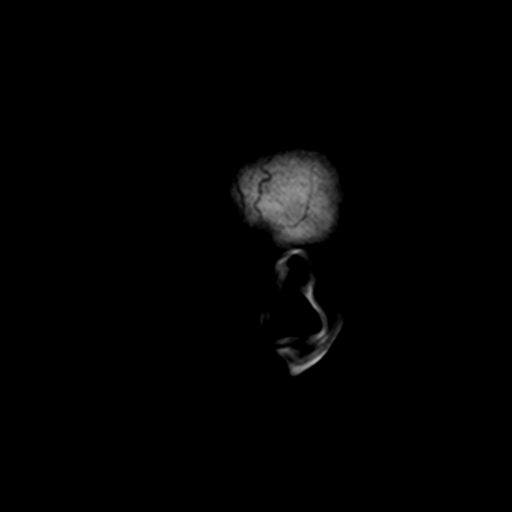
[im 12/23]
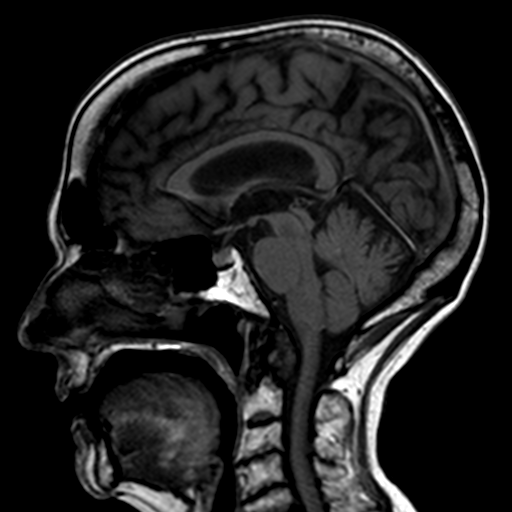
[im 23/23]
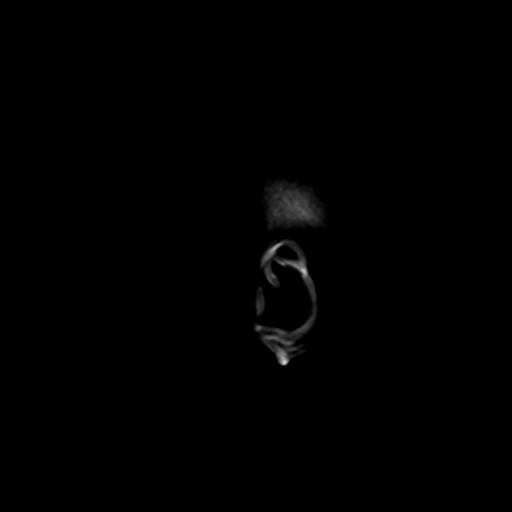

[Series 3: DWI · axial · 3.0mm · 2.19mm/px · z∈[-64,+91]mm · 9 of 94 slices shown (1 of 4)]
[im 1/94]
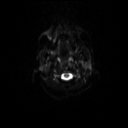
[im 12/94]
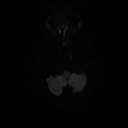
[im 24/94]
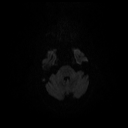
[im 35/94]
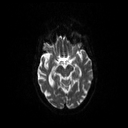
[im 47/94]
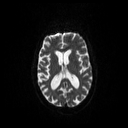
[im 59/94]
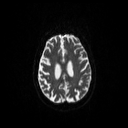
[im 70/94]
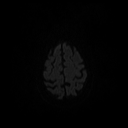
[im 82/94]
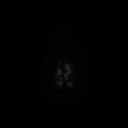
[im 94/94]
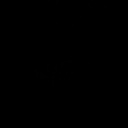

[Series 4: DWI · axial · 3.0mm · 2.19mm/px · z∈[-64,+88]mm · 4 of 47 slices shown (2 of 4)]
[im 1/47]
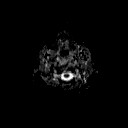
[im 16/47]
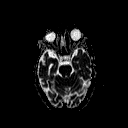
[im 31/47]
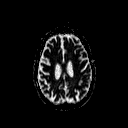
[im 47/47]
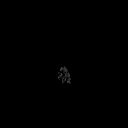

[Series 5: T2 · axial · 5.0mm · 0.45mm/px · z∈[-78,+82]mm · 2 of 24 slices shown (1 of 2)]
[im 1/24]
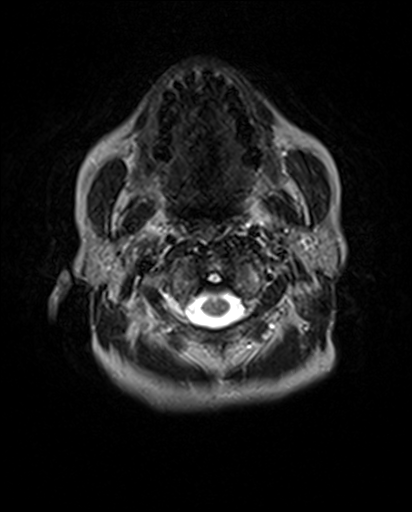
[im 24/24]
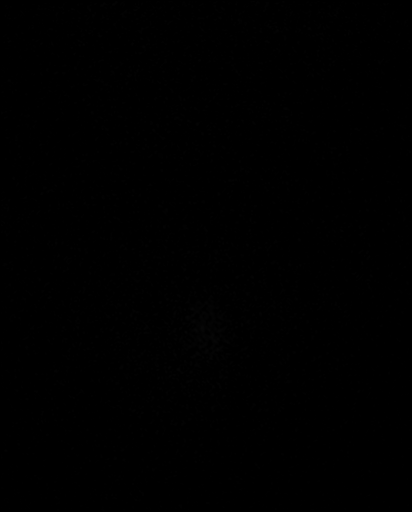

[Series 6: T2 · axial · 5.0mm · 0.45mm/px · z∈[-78,+82]mm · 2 of 24 slices shown (2 of 2)]
[im 1/24]
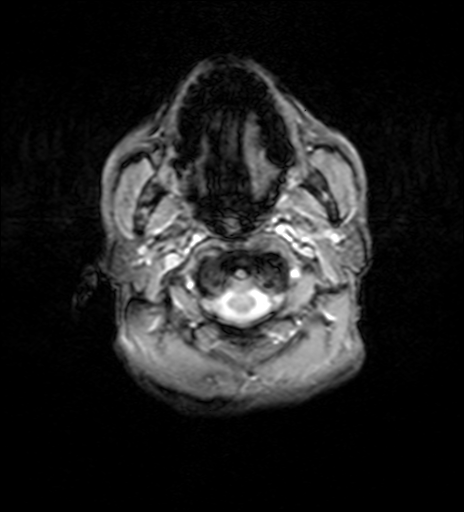
[im 24/24]
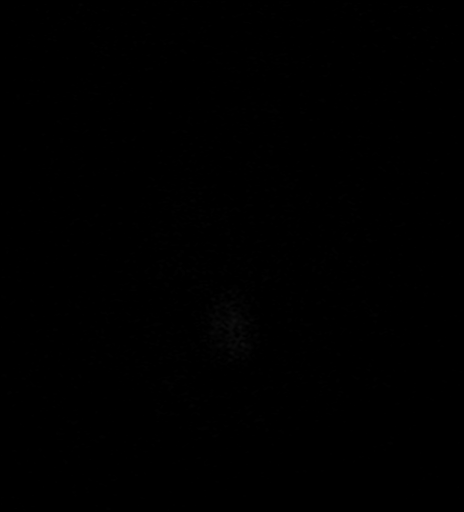

[Series 7: FLAIR · axial · 3.0mm · 0.45mm/px · z∈[-76,+79]mm · 2 of 27 slices shown]
[im 1/27]
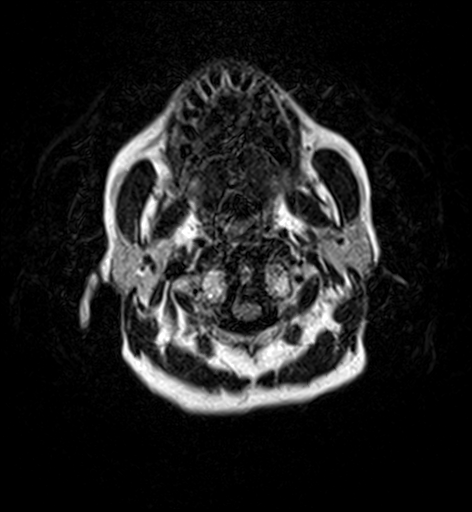
[im 27/27]
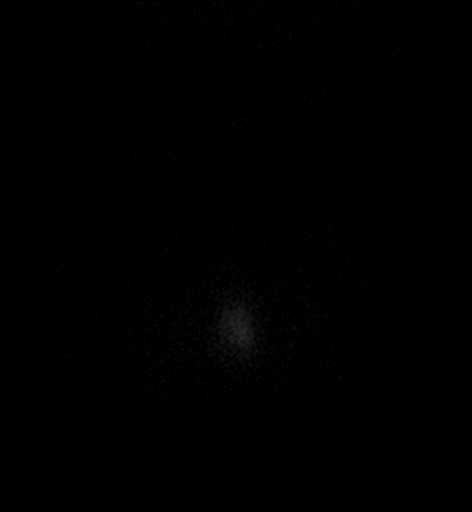

[Series 9: DWI · coronal · 3.0mm · 1.46mm/px · 8 of 96 slices shown (3 of 4)]
[im 1/96]
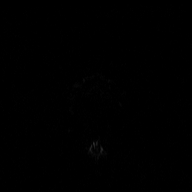
[im 14/96]
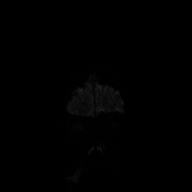
[im 28/96]
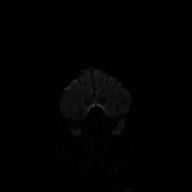
[im 41/96]
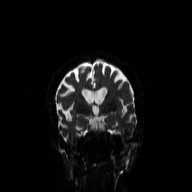
[im 55/96]
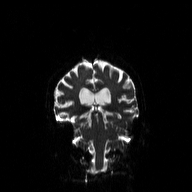
[im 68/96]
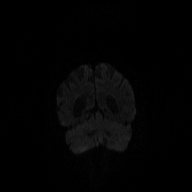
[im 82/96]
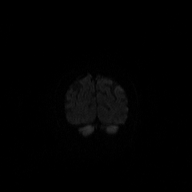
[im 96/96]
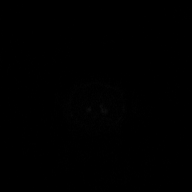

[Series 10: DWI · coronal · 3.0mm · 1.46mm/px · 4 of 48 slices shown (4 of 4)]
[im 1/48]
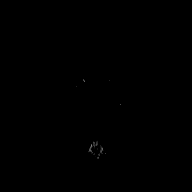
[im 16/48]
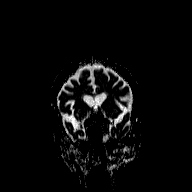
[im 32/48]
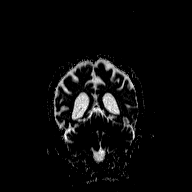
[im 48/48]
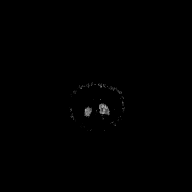

[Series 11: T2 post-contrast · coronal · 5.0mm · 0.45mm/px · 2 of 28 slices shown]
[im 1/28]
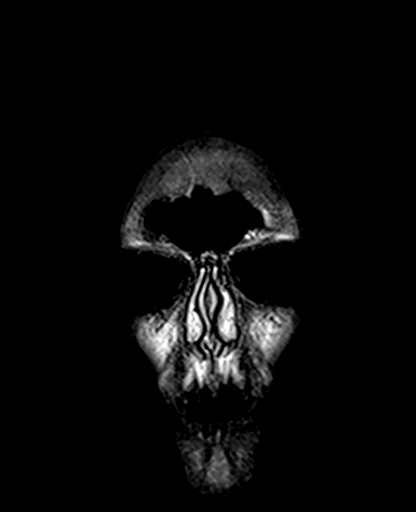
[im 28/28]
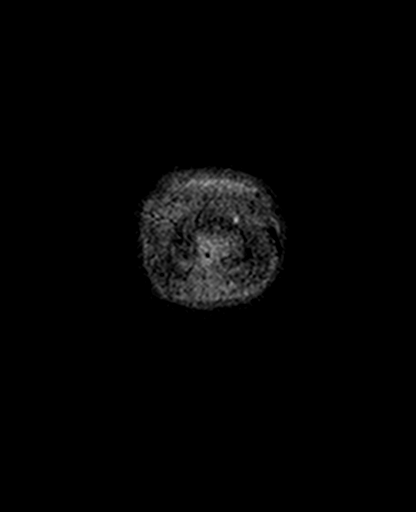

[Series 13: T1 post-contrast · coronal · 5.0mm · 0.45mm/px · 2 of 28 slices shown]
[im 1/28]
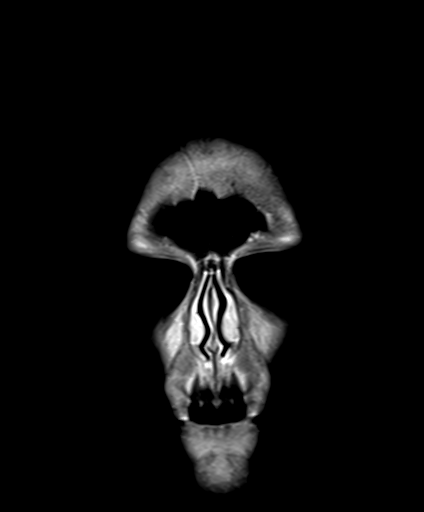
[im 28/28]
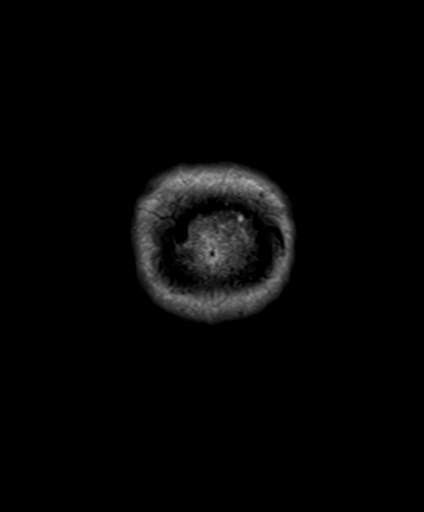

[38 of 48 positions shown; findings below may reference images not displayed]

FINDINGS: Brain: The midline structures are normal. There is no acute infarct
or acute hemorrhage. No mass lesion, hydrocephalus, dural
abnormality or extra-axial collection. Mild periventricular white
matter density. Generalized atrophy without lobar predilection. No
chronic microhemorrhage or superficial siderosis.

Vascular: Major intracranial arterial and venous sinus flow voids
are preserved.

Skull and upper cervical spine: The visualized skull base,
calvarium, upper cervical spine and extracranial soft tissues are
normal.

Sinuses/Orbits: No fluid levels or advanced mucosal thickening. No
mastoid or middle ear effusion. Normal orbits.
IMPRESSION: 1. Mild generalized atrophy without a clear lobar predominance.
Quantitative analysis with volumetric brain MRI might be helpful.
2. No acute abnormality.

## 2020-04-08 DEATH — deceased
